# Patient Record
Sex: Male | Born: 1985 | Race: White | Hispanic: No | Marital: Married | Smoking: Current every day smoker
Health system: Southern US, Community
[De-identification: ages and names within clinical notes are randomized; demographics above are authoritative.]

---

## 2019-07-27 ENCOUNTER — Encounter (HOSPITAL_COMMUNITY): Payer: Self-pay

## 2019-07-27 ENCOUNTER — Emergency Department (HOSPITAL_COMMUNITY)
Admission: EM | Admit: 2019-07-27 | Discharge: 2019-07-27 | Disposition: A | Discharge status: Home / Self Care | Payer: Self-pay | Attending: Emergency Medicine | Admitting: Emergency Medicine

## 2019-07-27 ENCOUNTER — Other Ambulatory Visit: Payer: Self-pay

## 2019-07-27 DIAGNOSIS — W268XXA Contact with other sharp object(s), not elsewhere classified, initial encounter: Secondary | ICD-10-CM | POA: Insufficient documentation

## 2019-07-27 DIAGNOSIS — Y990 Civilian activity done for income or pay: Secondary | ICD-10-CM | POA: Insufficient documentation

## 2019-07-27 DIAGNOSIS — Y9289 Other specified places as the place of occurrence of the external cause: Secondary | ICD-10-CM | POA: Insufficient documentation

## 2019-07-27 DIAGNOSIS — S61012A Laceration without foreign body of left thumb without damage to nail, initial encounter: Secondary | ICD-10-CM | POA: Insufficient documentation

## 2019-07-27 DIAGNOSIS — Y9389 Activity, other specified: Secondary | ICD-10-CM | POA: Insufficient documentation

## 2019-07-27 NOTE — ED Triage Notes (Signed)
Pt cut his left thumb with a sheet rock knife. Stated he put super glue in it. No bleeding at this time.

## 2019-07-27 NOTE — Discharge Instructions (Addendum)
I have placed Dermabond to your left finger, please keep this in place until it will eventually fall off.  You may keep the wound clean and dry for the next 24 hours.

## 2019-07-27 NOTE — ED Provider Notes (Signed)
[facility] EMERGENCY DEPARTMENT Provider Note   CSN: [id number] Arrival date & time: 07/27/19  1657     History Chief Complaint  Patient presents with  . Extremity Laceration    Noah Wang is a 34 y.o. male.  34 y.o male with no PMH presents to the ED with a chief complaint of left finger laceration x pta. Patient was at work when he suddenly had a piece of sheet rock cut the left thumb finger. Reports attempting to clean it, applying pressure and placing super glue on it. He has a tetanus vaccine within the past two years. He denies any numbness, or other injury.   The history is provided by the patient.       History reviewed. No pertinent past medical history.  There are no problems to display for this patient.   History reviewed. No pertinent surgical history.     No family history on file.  Social History   Tobacco Use  . Smoking status: Not on file  Substance Use Topics  . Alcohol use: Not on file  . Drug use: Not on file    Home Medications Prior to Admission medications   Not on File    Allergies    Patient has no allergy information on record.  Review of Systems   Review of Systems  Constitutional: Negative for fever.  Skin: Positive for wound.    Physical Exam Updated Vital Signs BP 134/87   Pulse 78   Temp 98 F (36.7 C) (Oral)   Resp 16   Ht 6\' 2"  (1.88 m)   Wt 111.1 kg   SpO2 98%   BMI 31.46 kg/m   Physical Exam Vitals and nursing note reviewed.  Constitutional:      Appearance: Normal appearance.  HENT:     Head: Normocephalic and atraumatic.  Eyes:     Pupils: Pupils are equal, round, and reactive to light.  Cardiovascular:     Rate and Rhythm: Normal rate.  Pulmonary:     Effort: Pulmonary effort is normal.  Abdominal:     General: Abdomen is flat.  Musculoskeletal:     Left hand: Laceration and tenderness present. No bony tenderness. Normal strength. Normal sensation. There is no disruption  of two-point discrimination. Normal capillary refill. Normal pulse.     Cervical back: Normal range of motion and neck supple.     Comments: Pulses present, capillary refill intact. No nail involvement. 1cm laceration to the left thumb.  Skin:    General: Skin is warm and dry.  Neurological:     Mental Status: He is alert and oriented to person, place, and time.     ED Results / Procedures / Treatments   Labs (all labs ordered are listed, but only abnormal results are displayed) Labs Reviewed - No data to display  EKG None  Radiology No results found.  Procedures . Laceration Repair  Date/Time: 07/27/2019 6:00 PM Performed by: 07/29/2019, PA-C Authorized by: Claude Manges, PA-C   Consent:    Consent obtained:  Verbal   Consent given by:  Patient   Risks discussed:  Infection, pain and poor cosmetic result   Alternatives discussed:  No treatment Anesthesia (see MAR for exact dosages):    Anesthesia method:  None Laceration details:    Location:  Finger   Finger location:  L thumb   Length (cm):  1 Repair type:    Repair type:  Simple Exploration:    Hemostasis achieved  with:  Direct pressure Treatment:    Area cleansed with:  Saline   Amount of cleaning:  Extensive Skin repair:    Repair method:  Tissue adhesive Approximation:    Approximation:  Close Post-procedure details:    Dressing:  Open (no dressing)   Patient tolerance of procedure:  Tolerated well, no immediate complications   (including critical care time)  Medications Ordered in ED Medications - No data to display  ED Course  I have reviewed the triage vital signs and the nursing notes.  Pertinent labs & imaging results that were available during my care of the patient were reviewed by me and considered in my medical decision making (see chart for details).    MDM Rules/Calculators/A&P    Patient with left thumb laceration approximately 1 cm, no nail involvement. Tetanus is up to date.    Exam is reassuring, no deep penetration. Neurovascularly intact. Will repair with dermabond. Return precautions discussed at length. Patient stable for discharge.    Portions of this note were generated with Scientist, clinical (histocompatibility and immunogenetics). Dictation errors may occur despite best attempts at proofreading.  Final Clinical Impression(s) / ED Diagnoses Final diagnoses:  Laceration of left thumb without foreign body without damage to nail, initial encounter    Rx / DC Orders ED Discharge Orders    None       Claude Manges, PA-C 07/27/19 1801    Pricilla Loveless, MD 07/27/19 2322

## 2020-09-16 ENCOUNTER — Emergency Department (HOSPITAL_COMMUNITY): Payer: Self-pay

## 2020-09-16 ENCOUNTER — Other Ambulatory Visit: Payer: Self-pay

## 2020-09-16 ENCOUNTER — Emergency Department (HOSPITAL_COMMUNITY)
Admission: EM | Admit: 2020-09-16 | Discharge: 2020-09-16 | Disposition: A | Discharge status: Home / Self Care | Payer: Self-pay | Attending: Emergency Medicine | Admitting: Emergency Medicine

## 2020-09-16 ENCOUNTER — Encounter (HOSPITAL_COMMUNITY): Payer: Self-pay

## 2020-09-16 DIAGNOSIS — K625 Hemorrhage of anus and rectum: Secondary | ICD-10-CM | POA: Insufficient documentation

## 2020-09-16 DIAGNOSIS — R1011 Right upper quadrant pain: Secondary | ICD-10-CM

## 2020-09-16 DIAGNOSIS — K5792 Diverticulitis of intestine, part unspecified, without perforation or abscess without bleeding: Secondary | ICD-10-CM | POA: Insufficient documentation

## 2020-09-16 DIAGNOSIS — F17210 Nicotine dependence, cigarettes, uncomplicated: Secondary | ICD-10-CM | POA: Insufficient documentation

## 2020-09-16 DIAGNOSIS — R7309 Other abnormal glucose: Secondary | ICD-10-CM | POA: Insufficient documentation

## 2020-09-16 LAB — URINALYSIS, ROUTINE W REFLEX MICROSCOPIC
Bacteria, UA: NONE SEEN
Bilirubin Urine: NEGATIVE
Glucose, UA: NEGATIVE mg/dL
Hgb urine dipstick: NEGATIVE
Ketones, ur: NEGATIVE mg/dL
Leukocytes,Ua: NEGATIVE
Nitrite: NEGATIVE
Protein, ur: NEGATIVE mg/dL
Specific Gravity, Urine: >1.03 — ABNORMAL HIGH (ref 1.005–1.030)
pH: 5.5 (ref 5.0–8.0)

## 2020-09-16 LAB — CBC WITH DIFFERENTIAL/PLATELET
Abs Immature Granulocytes: 0.11 10*3/uL — ABNORMAL HIGH (ref 0.00–0.07)
Basophils Absolute: 0.1 10*3/uL (ref 0.0–0.1)
Basophils Relative: 1 %
Eosinophils Absolute: 0.4 10*3/uL (ref 0.0–0.5)
Eosinophils Relative: 3 %
HCT: 51 % (ref 39.0–52.0)
Hemoglobin: 17.3 g/dL — ABNORMAL HIGH (ref 13.0–17.0)
Immature Granulocytes: 1 %
Lymphocytes Relative: 15 %
Lymphs Abs: 2.2 10*3/uL (ref 0.7–4.0)
MCH: 33.5 pg (ref 26.0–34.0)
MCHC: 33.9 g/dL (ref 30.0–36.0)
MCV: 98.6 fL (ref 80.0–100.0)
Monocytes Absolute: 1.1 10*3/uL — ABNORMAL HIGH (ref 0.1–1.0)
Monocytes Relative: 8 %
Neutro Abs: 10.1 10*3/uL — ABNORMAL HIGH (ref 1.7–7.7)
Neutrophils Relative %: 72 %
Platelets: 330 10*3/uL (ref 150–400)
RBC: 5.17 MIL/uL (ref 4.22–5.81)
RDW: 12.4 % (ref 11.5–15.5)
WBC: 14 10*3/uL — ABNORMAL HIGH (ref 4.0–10.5)
nRBC: 0 % (ref 0.0–0.2)

## 2020-09-16 LAB — COMPREHENSIVE METABOLIC PANEL
ALT: 198 U/L — ABNORMAL HIGH (ref 0–44)
AST: 113 U/L — ABNORMAL HIGH (ref 15–41)
Albumin: 4.9 g/dL (ref 3.5–5.0)
Alkaline Phosphatase: 96 U/L (ref 38–126)
Anion gap: 10 (ref 5–15)
BUN: 11 mg/dL (ref 6–20)
CO2: 24 mmol/L (ref 22–32)
Calcium: 10.2 mg/dL (ref 8.9–10.3)
Chloride: 105 mmol/L (ref 98–111)
Creatinine, Ser: 0.89 mg/dL (ref 0.61–1.24)
GFR, Estimated: >60 mL/min (ref >60)
Glucose, Bld: 109 mg/dL — ABNORMAL HIGH (ref 70–99)
Potassium: 4.6 mmol/L (ref 3.5–5.1)
Sodium: 139 mmol/L (ref 135–145)
Total Bilirubin: 0.8 mg/dL (ref 0.3–1.2)
Total Protein: 8.4 g/dL — ABNORMAL HIGH (ref 6.5–8.1)

## 2020-09-16 LAB — LIPASE, BLOOD: Lipase: 35 U/L (ref 11–51)

## 2020-09-16 LAB — CBG MONITORING, ED: Glucose-Capillary: 114 mg/dL — ABNORMAL HIGH (ref 70–99)

## 2020-09-16 LAB — POC OCCULT BLOOD, ED: Fecal Occult Bld: POSITIVE — AB

## 2020-09-16 MED ORDER — SODIUM CHLORIDE 0.9 % IV BOLUS
1000.0000 mL | Freq: Once | INTRAVENOUS | Status: AC
  Administered 2020-09-16: 1000 mL via INTRAVENOUS

## 2020-09-16 MED ORDER — ONDANSETRON 4 MG PO TBDP: 4.0000 mg | ORAL_TABLET | Freq: Three times a day (TID) | ORAL | 0 refills | Status: AC | PRN

## 2020-09-16 MED ORDER — MORPHINE SULFATE (PF) 4 MG/ML IV SOLN
4.0000 mg | Freq: Once | INTRAVENOUS | Status: AC
  Administered 2020-09-16: 4 mg via INTRAVENOUS
  Filled 2020-09-16: qty 1

## 2020-09-16 MED ORDER — ONDANSETRON HCL 4 MG/2ML IJ SOLN
4.0000 mg | Freq: Once | INTRAMUSCULAR | Status: AC
  Administered 2020-09-16: 4 mg via INTRAVENOUS
  Filled 2020-09-16: qty 2

## 2020-09-16 MED ORDER — IOHEXOL 350 MG/ML SOLN
80.0000 mL | Freq: Once | INTRAVENOUS | Status: AC | PRN
  Administered 2020-09-16: 80 mL via INTRAVENOUS

## 2020-09-16 MED ORDER — AMOXICILLIN-POT CLAVULANATE 875-125 MG PO TABS: 1.0000 | ORAL_TABLET | Freq: Two times a day (BID) | ORAL | 0 refills | Status: AC

## 2020-09-16 MED ORDER — ACETAMINOPHEN 500 MG PO TABS
1000.0000 mg | ORAL_TABLET | Freq: Once | ORAL | Status: AC
  Administered 2020-09-16: 1000 mg via ORAL
  Filled 2020-09-16: qty 2

## 2020-09-16 NOTE — ED Provider Notes (Signed)
Emergency Medicine Provider Triage Evaluation Note  Noah Wang , a 35 y.o. male  was evaluated in triage.  Pt complains of approximately 3 to 4 days of right-sided abdominal pain and cramping, decreased appetite.  Today he began having bright red blood per rectum.  No lightheadedness or syncope.  No history of inflammatory bowel disease or abdominal surgeries.  Review of Systems  Positive: Abdominal pain, rectal bleeding Negative: Rectal pain  Physical Exam  BP (!) 152/114   Pulse (!) 109   Temp 98.2 F (36.8 C) (Oral)   Resp 18   Ht 6\' 3"  (1.905 m)   Wt 116.3 kg   SpO2 99%   BMI 32.06 kg/m  Gen:   Awake, no distress   Resp:  Normal effort  MSK:   Moves extremities without difficulty  Other:  Abdomen, generalized right-sided abdominal tenderness, mild to moderate, without rebound or guarding  Medical Decision Making  Medically screening exam initiated at 1:13 PM.  Appropriate orders placed.  Noah Wang was informed that the remainder of the evaluation will be completed by another provider, this initial triage assessment does not replace that evaluation, and the importance of remaining in the ED until their evaluation is complete.     Duard Brady, PA-C 09/16/20 1314    11/16/20, MD 09/17/20 1308

## 2020-09-16 NOTE — ED Provider Notes (Signed)
[facility] DEPT Provider Note   CSN: [id number] Arrival date & time: 09/16/20  1128     History Chief Complaint  Patient presents with   Abdominal Pain   Rectal Bleeding    Noah Wang is a 35 y.o. male with no past pertinent medical history.  Presents to the emergency department with a chief plaint of right-sided abdominal pain and rectal bleeding.  Patient reports that he has had abdominal pain over the last 3 to 4 days.  Pain is located on the right side of his abdomen and radiates to his umbilicus.  Patient reports that pain is constant however waxes and wanes in intensity.  Patient describes pain as a "cramping and twisting pain."  At present patient rates pain 1/10 on the pain scale.  Patient denies any aggravating factors.  Patient has had minimal relief with over-the-counter pain medication.  Patient states that this morning had bright red blood mixed in with his stool.  Patient reports 4 episodes of bloody stool.  Patient approximates 1 pint of blood throughout all of his bowel movements this morning.  Patient denies any pain with defecation, melena.  Patient denies any receptive anal sex.  Additionally patient endorses nausea and decreased appetite over the last 3 to 4 days.  Patient denies any fevers, chills, constipation, diarrhea, abdominal distention, dysuria, hematuria, urinary frequency, penile discharge, swelling or tenderness to genitals, genital sores or lesions, back pain, lightheadedness, syncope.  Patient endorses occasional EtOH use, last drink was 2 days prior.  Patient states that he has 6-8 beers weekly.  Patient endorses occasional cocaine use.  Patient reports that he had 1 line of cocaine last week.  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   Abdominal Pain Associated symptoms: hematochezia and nausea   Associated symptoms: no chest pain, no chills, no constipation, no diarrhea, no dysuria,  no fever, no hematuria, no shortness of breath and no vomiting   Rectal Bleeding Associated symptoms: abdominal pain   Associated symptoms: no dizziness, no fever, no light-headedness and no vomiting       History reviewed. No pertinent past medical history.  There are no problems to display for this patient.   History reviewed. No pertinent surgical history.     History reviewed. No pertinent family history.  Social History   Tobacco Use   Smoking status: Every Day    Packs/day: 1.00    Types: Cigarettes   Smokeless tobacco: Never    Home Medications Prior to Admission medications   Not on File    Allergies    Patient has no known allergies.  Review of Systems   Review of Systems  Constitutional:  Negative for chills and fever.  Eyes:  Negative for visual disturbance.  Respiratory:  Negative for shortness of breath.   Cardiovascular:  Negative for chest pain.  Gastrointestinal:  Positive for abdominal pain, blood in stool, hematochezia and nausea. Negative for abdominal distention, anal bleeding, constipation, diarrhea, rectal pain and vomiting.  Genitourinary:  Negative for difficulty urinating, dysuria, flank pain, frequency, genital sores, hematuria, penile discharge, penile pain, penile swelling, scrotal swelling and testicular pain.  Musculoskeletal:  Negative for back pain and neck pain.  Skin:  Negative for color change and rash.  Neurological:  Negative for dizziness, syncope, light-headedness and headaches.  Psychiatric/Behavioral:  Negative for confusion.    Physical Exam Updated Vital Signs BP (!) 152/114   Pulse (!) 109  Temp 98.2 F (36.8 C) (Oral)   Resp 18   Ht '6\' 3"'  (1.905 m)   Wt 116.3 kg   SpO2 99%   BMI 32.06 kg/m   Physical Exam Vitals and nursing note reviewed. Exam conducted with a chaperone present (Male RN present as chaperone).  Constitutional:      General: He is not in acute distress.    Appearance: He is not ill-appearing,  toxic-appearing or diaphoretic.  HENT:     Head: Normocephalic.  Eyes:     General: No scleral icterus.       Right eye: No discharge.        Left eye: No discharge.  Cardiovascular:     Rate and Rhythm: Normal rate.  Pulmonary:     Effort: Pulmonary effort is normal.  Abdominal:     General: Abdomen is protuberant. Bowel sounds are normal. There is no distension. There are no signs of injury.     Palpations: Abdomen is soft. There is no mass or pulsatile mass.     Tenderness: There is abdominal tenderness in the right upper quadrant and right lower quadrant. There is no right CVA tenderness, left CVA tenderness, guarding or rebound. Positive signs include Murphy's sign and McBurney's sign.     Hernia: There is no hernia in the umbilical area or ventral area.  Genitourinary:    Rectum: No mass, tenderness, anal fissure, external hemorrhoid or internal hemorrhoid. Normal anal tone.     Comments: Minimal light brown stool noted in rectal vault, small amount of bright red blood noted finger after digital rectal exam. Musculoskeletal:     Right lower leg: Normal.     Left lower leg: Normal.  Skin:    General: Skin is warm and dry.  Neurological:     General: No focal deficit present.     Mental Status: He is alert.  Psychiatric:        Behavior: Behavior is cooperative.    ED Results / Procedures / Treatments   Labs (all labs ordered are listed, but only abnormal results are displayed) Labs Reviewed  CBC WITH DIFFERENTIAL/PLATELET - Abnormal; Notable for the following components:      Result Value   WBC 14.0 (*)    Hemoglobin 17.3 (*)    Neutro Abs 10.1 (*)    Monocytes Absolute 1.1 (*)    Abs Immature Granulocytes 0.11 (*)    All other components within normal limits  COMPREHENSIVE METABOLIC PANEL - Abnormal; Notable for the following components:   Glucose, Bld 109 (*)    Total Protein 8.4 (*)    AST 113 (*)    ALT 198 (*)    All other components within normal limits   URINALYSIS, ROUTINE W REFLEX MICROSCOPIC - Abnormal; Notable for the following components:   Color, Urine YELLOW (*)    APPearance CLEAR (*)    Specific Gravity, Urine >1.030 (*)    All other components within normal limits  POC OCCULT BLOOD, ED - Abnormal; Notable for the following components:   Fecal Occult Bld POSITIVE (*)    All other components within normal limits  CBG MONITORING, ED - Abnormal; Notable for the following components:   Glucose-Capillary 114 (*)    All other components within normal limits  LIPASE, BLOOD    EKG None  Radiology CT Abdomen Pelvis W Contrast  Result Date: 09/16/2020 CLINICAL DATA:  Diffuse abdominal pain with bright red rectal bleeding. EXAM: CT ABDOMEN AND PELVIS WITH CONTRAST TECHNIQUE:  Multidetector CT imaging of the abdomen and pelvis was performed using the standard protocol following bolus administration of intravenous contrast. CONTRAST:  70m OMNIPAQUE IOHEXOL 350 MG/ML SOLN COMPARISON:  None. FINDINGS: Lower chest: No acute abnormality. Hepatobiliary: Severe diffuse hepatic steatosis with some geographic areas of sparing. Gallbladder is unremarkable. No biliary ductal dilation. Pancreas: No evidence of acute inflammation. No pancreatic ductal dilation. Spleen: Unremarkable. Adrenals/Urinary Tract: Adrenal glands are unremarkable. Kidneys are normal, without renal calculi, solid enhancing lesion, or hydronephrosis. Bladder is unremarkable for degree of distension. Stomach/Bowel: Stomach is unremarkable for degree of distension. No pathologic dilation of small or large bowel. The appendix and terminal ileum appear normal. Colonic diverticulosis without findings of acute diverticulitis. No evidence of acute bowel inflammation. Vascular/Lymphatic: Scattered aortic atherosclerosis without aneurysmal dilation. No pathologically enlarged abdominal or pelvic lymph nodes. Reproductive: Prostate is unremarkable. Other: No abdominopelvic ascites. Musculoskeletal:  L5 and T7 vertebral body hemangiomas. No acute osseous abnormality. IMPRESSION: 1. No acute findings in the abdomen or pelvis. 2. Severe diffuse hepatic steatosis with geographic areas of sparing. 3. Colonic diverticulosis without findings of acute diverticulitis. 4.  Aortic Atherosclerosis (ICD10-I70.0). Electronically Signed   By: JDahlia BailiffM.D.   On: 09/16/2020 18:33   [place]Abdomen Limited RUQ (LIVER/GB)  Result Date: 09/16/2020 CLINICAL DATA:  Right upper quadrant abdominal pain. EXAM: ULTRASOUND ABDOMEN LIMITED RIGHT UPPER QUADRANT COMPARISON:  CT of the abdomen and pelvis with contrast is 09/16/2020 FINDINGS: Gallbladder: No gallstones or wall thickening visualized. No sonographic Murphy sign noted by sonographer. Common bile duct: Diameter: Month clearly seen. Liver: Liver is diffusely hyperechoic compatible with narrowing fatty infiltration. No discrete lesions are present. Portal vein is patent on color Doppler imaging with normal direction of blood flow towards the liver. Other: None. IMPRESSION: 1. Hepatic steatosis. 2. Poor visualization of the gallbladder. The gallbladder appears to be normal. Electronically Signed   By: CSan MorelleM.D.   On: 09/16/2020 20:59    Procedures Procedures   Medications Ordered in ED Medications  acetaminophen (TYLENOL) tablet 1,000 mg (1,000 mg Oral Given 09/16/20 1618)  ondansetron (ZOFRAN) injection 4 mg (4 mg Intravenous Given 09/16/20 1618)  iohexol (OMNIPAQUE) 350 MG/ML injection 80 mL (80 mLs Intravenous Contrast Given 09/16/20 1803)  sodium chloride 0.9 % bolus 1,000 mL (0 mLs Intravenous Stopped 09/16/20 2118)  morphine 4 MG/ML injection 4 mg (4 mg Intravenous Given 09/16/20 2005)    ED Course  I have reviewed the triage vital signs and the nursing notes.  Pertinent labs & imaging results that were available during my care of the patient were reviewed by me and considered in my medical decision making (see chart for details).    MDM  Rules/Calculators/A&P                           Alert 35year old male in no acute stress, nontoxic-appearing.  Presents to ED with chief complaint of abdominal pain and bright red blood in stool.  Abdomen protuberant, soft, nondistended, tenderness to right upper and lower quadrant, no guarding or rebound tenderness.  Based on patient's physical exam and HPI concern for possible appendicitis or inflammatory bowel disease.  Basic lab work and CT scan obtained while patient was in progress.  CBC showed leukocytosis of 14.0, no signs of anemia. CMP shows AST and ALT elevated at 113 and 198 respectively.  Alk phos and total bili within normal limits, low suspicion for acute cholangitis at this time. Lipase within  normal limits, low suspicion for acute finger tightness at this time. Urinalysis shows no signs of infection Hemoccult positive  CT abdomen pelvis shows no acute findings in abdomen or pelvis.  Severe diffuse hepatic steatosis.  Colonic diverticulosis without findings of acute diverticulitis.  Due to patient's positive Murphy sign and elevated liver enzymes will obtain right upper quadrant ultrasound to evaluate for acute cholecystitis.  Right upper quadrant ultrasound shows hepatic steatosis, gallbladder appears to be normal.  Suspect that patient bloody stool is due to diverticular bleed.  Due to patient's abdominal pain and visualized diverticulosis will give patient 7-day course of Augmentin to treat for diverticulitis.  Patient will follow-up with gastroenterology.  Discussed results, findings, treatment and follow up. Patient advised of return precautions. Patient verbalized understanding and agreed with plan.  Patient care and treatment were discussed with attending physician Dr. Sherry Ruffing   Final Clinical Impression(s) / ED Diagnoses Final diagnoses:  Right upper quadrant pain  Diverticulitis    Rx / DC Orders ED Discharge Orders          Ordered     amoxicillin-clavulanate (AUGMENTIN) 875-125 MG tablet  Every 12 hours        09/16/20 2114    ondansetron (ZOFRAN ODT) 4 MG disintegrating tablet  Every 8 hours PRN        09/16/20 2115             Loni Beckwith, PA-C 09/17/20 5750    Tegeler, Gwenyth Allegra, MD 09/18/20 0010

## 2020-09-16 NOTE — ED Notes (Signed)
Pt given sandwich and ginger ale per [place] PA.

## 2020-09-16 NOTE — ED Triage Notes (Signed)
Pt arrived via POV, c/o RLQ abd pain x3 days, and bloody stools today.

## 2020-09-16 NOTE — Discharge Instructions (Addendum)
You came to the emergency room today with complaints of abdominal pain.  The ultrasound showed that you have hepatic steatosis.  The gallbladder was unremarkable.  CT scanning showed that you have diverticulosis.  With your complains of pain concern for possible diverticulitis.  Due to this we have started the antibiotic Augmentin.  You will need to follow-up with gastroenterology.    You may have diarrhea from the antibiotics.  It is very important that you continue to take the antibiotics even if you get diarrhea unless a medical professional tells you that you may stop taking them.  If you stop too early the bacteria you are being treated for will become stronger and you may need different, more powerful antibiotics that have more side effects and worsening diarrhea.  Please stay well hydrated and consider probiotics as they may decrease the severity of your diarrhea.   Please take Ibuprofen (Advil, motrin) and Tylenol (acetaminophen) to relieve your pain.    You may take up to 600 MG (3 pills) of normal strength ibuprofen every 8 hours as needed.   You make take tylenol, up to 1,000 mg (two extra strength pills) every 8 hours as needed.   It is safe to take ibuprofen and tylenol at the same time as they work differently.   Do not take more than 3,000 mg tylenol in a 24 hour period (not more than one dose every 8 hours.  Please check all medication labels as many medications such as pain and cold medications may contain tylenol.  Do not drink alcohol while taking these medications.  Do not take other NSAID'S while taking ibuprofen (such as aleve or naproxen).  Please take ibuprofen with food to decrease stomach upset.  Get help right away if: Your pain gets worse. Your symptoms do not get better with treatment. Your symptoms suddenly get worse. You have a fever. You vomit more than one time. You have stools that are bloody, black, or tarry.

## 2020-11-16 ENCOUNTER — Emergency Department (HOSPITAL_COMMUNITY)
Admission: EM | Admit: 2020-11-16 | Discharge: 2020-11-16 | Disposition: A | Discharge status: Home / Self Care | Payer: Medicaid Other | Attending: Emergency Medicine | Admitting: Emergency Medicine

## 2020-11-16 ENCOUNTER — Emergency Department (HOSPITAL_COMMUNITY): Payer: Medicaid Other

## 2020-11-16 ENCOUNTER — Encounter (HOSPITAL_COMMUNITY): Payer: Self-pay | Admitting: Emergency Medicine

## 2020-11-16 DIAGNOSIS — Z20822 Contact with and (suspected) exposure to covid-19: Secondary | ICD-10-CM | POA: Insufficient documentation

## 2020-11-16 DIAGNOSIS — Z720 Tobacco use: Secondary | ICD-10-CM

## 2020-11-16 DIAGNOSIS — I10 Essential (primary) hypertension: Secondary | ICD-10-CM

## 2020-11-16 DIAGNOSIS — J40 Bronchitis, not specified as acute or chronic: Secondary | ICD-10-CM

## 2020-11-16 DIAGNOSIS — F17210 Nicotine dependence, cigarettes, uncomplicated: Secondary | ICD-10-CM | POA: Insufficient documentation

## 2020-11-16 LAB — RESP PANEL BY RT-PCR (FLU A&B, COVID) ARPGX2
Influenza A by PCR: NEGATIVE
Influenza B by PCR: NEGATIVE
SARS Coronavirus 2 by RT PCR: NEGATIVE

## 2020-11-16 MED ORDER — ALBUTEROL SULFATE HFA 108 (90 BASE) MCG/ACT IN AERS
4.0000 | INHALATION_SPRAY | Freq: Once | RESPIRATORY_TRACT | Status: AC
  Administered 2020-11-16: 4 via RESPIRATORY_TRACT

## 2020-11-16 MED ORDER — AEROCHAMBER PLUS FLO-VU MEDIUM MISC
1.0000 | Freq: Once | Status: AC
  Administered 2020-11-16: 1
  Filled 2020-11-16: qty 1

## 2020-11-16 MED ORDER — DEXAMETHASONE 4 MG PO TABS
6.0000 mg | ORAL_TABLET | Freq: Once | ORAL | Status: AC
  Administered 2020-11-16: 6 mg via ORAL
  Filled 2020-11-16: qty 1

## 2020-11-16 MED ORDER — ALBUTEROL SULFATE HFA 108 (90 BASE) MCG/ACT IN AERS
2.0000 | INHALATION_SPRAY | Freq: Once | RESPIRATORY_TRACT | Status: AC
  Administered 2020-11-16: 2 via RESPIRATORY_TRACT
  Filled 2020-11-16: qty 6.7

## 2020-11-16 NOTE — Discharge Instructions (Signed)
I would recommend that you use this as motivation to stop smoking.  Today your flu and COVID test were negative. Your blood pressure was high which is most likely due to being sick.  Please avoid any decongesting medications.  If you need to take a cough medicine you can take Coricidin HBP which is safe for people with elevated blood pressures.  You can take Mucinex, but do not take Mucinex decongestant. You can use your inhaler every 4-6 hours as needed by using 2 puffs.  If you develop fevers, significant shortness of breath, or have other concerns please seek additional medical care. Today you were given steroids.  These can make you feel hot, flushed, have increased appetite and mood swings.  These should last for a few days.

## 2020-11-16 NOTE — ED Notes (Signed)
An After Visit Summary was printed and given to the patient. Discharge instructions given and no further questions at this time.  

## 2020-11-16 NOTE — ED Provider Notes (Signed)
[facility]-EMERGENCY DEPT Provider Note   CSN: [id number] Arrival date & time: 11/16/20  1842     History Chief Complaint  Patient presents with   Cough    Noah Wang is a 35 y.o. male who presents today for evaluation of cough.  He states that he has had a cough for about 2 days.  He feels like he hears a wheeze in his chest, he has been slightly short of breath when he had a coughing spell while walking but not otherwise. No leg swelling. He does smoke.  He denies a history of asthma.  No fevers.  He has not been around any known sick contacts.  No significant chest pain.  No headache or body aches.  No other concerns or complaints today.  HPI     History reviewed. No pertinent past medical history.  There are no problems to display for this patient.   History reviewed. No pertinent surgical history.     History reviewed. No pertinent family history.  Social History   Tobacco Use   Smoking status: Every Day    Packs/day: 1.00    Types: Cigarettes   Smokeless tobacco: Never    Home Medications Prior to Admission medications   Medication Sig Start Date End Date Taking? Authorizing Provider  amoxicillin-clavulanate (AUGMENTIN) 875-125 MG tablet Take 1 tablet by mouth every 12 (twelve) hours. 09/16/20   Haskel Schroeder, PA-C  ondansetron (ZOFRAN ODT) 4 MG disintegrating tablet Take 1 tablet (4 mg total) by mouth every 8 (eight) hours as needed for nausea or vomiting. 09/16/20   Haskel Schroeder, PA-C    Allergies    Other  Review of Systems   Review of Systems  Constitutional:  Negative for activity change, fatigue and fever.  HENT:  Negative for congestion and postnasal drip.   Eyes:  Negative for visual disturbance.  Respiratory:  Positive for cough and shortness of breath. Negative for chest tightness.   Cardiovascular:  Negative for chest pain and leg swelling.  Gastrointestinal:  Negative for abdominal pain.   Musculoskeletal:  Negative for back pain and neck pain.  Skin:  Negative for color change and rash.  Neurological:  Negative for weakness and headaches.  Psychiatric/Behavioral:  Negative for confusion.   All other systems reviewed and are negative.  Physical Exam Updated Vital Signs BP (!) 141/79   Pulse 90   Temp 98.1 F (36.7 C) (Oral)   Resp 18   Ht 6\' 3"  (1.905 m)   Wt 113.4 kg   SpO2 99%   BMI 31.25 kg/m   Physical Exam Constitutional:      General: He is not in acute distress.    Appearance: He is well-developed. He is not ill-appearing or diaphoretic.  HENT:     Head: Normocephalic and atraumatic.     Nose: Mucosal edema, congestion and rhinorrhea present.     Mouth/Throat:     Mouth: Mucous membranes are moist.     Pharynx: Oropharynx is clear. Uvula midline. No oropharyngeal exudate or posterior oropharyngeal erythema.     Tonsils: No tonsillar exudate.  Eyes:     General: No scleral icterus.    Conjunctiva/sclera: Conjunctivae normal.  Cardiovascular:     Rate and Rhythm: Normal rate and regular rhythm.  Pulmonary:     Effort: Pulmonary effort is normal.     Breath sounds: Wheezing and rhonchi present.     Comments: Occasional cough Abdominal:     Tenderness: There  is no abdominal tenderness. There is no guarding.  Musculoskeletal:        General: Normal range of motion.     Cervical back: Normal range of motion and neck supple.     Right lower leg: No edema.     Left lower leg: No edema.  Lymphadenopathy:     Cervical: No cervical adenopathy.  Skin:    General: Skin is warm and dry.  Neurological:     Mental Status: He is alert.     Comments: Patient is awake and alert.  Answers questions appropriately without difficulty.  Psychiatric:        Mood and Affect: Mood normal.        Behavior: Behavior normal.    ED Results / Procedures / Treatments   Labs (all labs ordered are listed, but only abnormal results are displayed) Labs Reviewed  RESP  PANEL BY RT-PCR (FLU A&B, COVID) [id number]    EKG None  Radiology DG Chest 2 View  Result Date: 11/16/2020 CLINICAL DATA:  Cough for 2 days.  Current smoker EXAM: CHEST - 2 VIEW COMPARISON:  None. FINDINGS: The cardiomediastinal contours are within normal limits. No focal consolidation. Mild prominence of the interstitium. No pneumothorax or pleural effusion. No acute finding in the visualized skeleton. IMPRESSION: Mild prominence of the interstitium which is nonspecific, could be represent bronchitic changes related to smoking history or viral infection in the acute setting. Electronically Signed   By: Emmaline Kluver M.D.   On: 11/16/2020 19:19    Procedures Procedures   Medications Ordered in ED Medications  albuterol (VENTOLIN HFA) 108 (90 Base) MCG/ACT inhaler 2 puff (2 puffs Inhalation Given 11/16/20 1913)  AeroChamber Plus Flo-Vu Medium MISC 1 each (1 each Other Given 11/16/20 1913)  albuterol (VENTOLIN HFA) 108 (90 Base) MCG/ACT inhaler 4 puff (4 puffs Inhalation Given 11/16/20 1956)  dexamethasone (DECADRON) tablet 6 mg (6 mg Oral Given 11/16/20 2130)    ED Course  I have reviewed the triage vital signs and the nursing notes.  Pertinent labs & imaging results that were available during my care of the patient were reviewed by me and considered in my medical decision making (see chart for details).  Clinical Course as of 11/16/20 2157  Thu Nov 16, 2020  1958 Patient reevaluated.  He still has diffuse wheezes bilaterally.  He feels like he has been increased coughing since the initial albuterol.  He does not feel like his chest is tight.  We will give additional dose of albuterol. [EH]    Clinical Course User Index [EH] Norman Clay   MDM Rules/Calculators/A&P                          Patient is a 35 year old man who presents today for evaluation of cough and hearing a wheeze in his chest. On exam he has diffuse wheezes bilaterally.  He does not have an asthma  history.  He does smoke, states that he only smokes about 4 cigarettes a day. Who COVID test is negative.  Chest x-ray is obtained showing changes that may be consistent with bronchitis.  Given that his flu and COVID test is negative does not require Tamiflu or other oral antivirals.  We discussed that he may have a different virus. He is treated in the emergency room with albuterol.  After this his lung sounds improved significantly and his saturation was 99% on room air.  He was slightly hypertensive  here which may be due to use of decongestants at home.  He was recommended to avoid decongestants.  He is given the inhaler to use at home. We will give him a one-time dose of Decadron here.  Work note is given.  Noah Wang was evaluated in Emergency Department on 11/16/2020 for the symptoms described in the history of present illness. He was evaluated in the context of the global COVID-19 pandemic, which necessitated consideration that the patient might be at risk for infection with the SARS-CoV-2 virus that causes COVID-19. Institutional protocols and algorithms that pertain to the evaluation of patients at risk for COVID-19 are in a state of rapid change based on information released by regulatory bodies including the CDC and federal and state organizations. These policies and algorithms were followed during the patient's care in the ED.   Return precautions were discussed with patient who states their understanding.  At the time of discharge patient denied any unaddressed complaints or concerns.  Patient is agreeable for discharge home.  Note: Portions of this report may have been transcribed using voice recognition software. Every effort was made to ensure accuracy; however, inadvertent computerized transcription errors may be present   Final Clinical Impression(s) / ED Diagnoses Final diagnoses:  Bronchitis  Tobacco use  Hypertension, unspecified type    Rx / DC Orders ED Discharge  Orders     None        Norman Clay 11/16/20 2201    Charlynne Pander, MD 11/16/20 [phone]

## 2020-11-16 NOTE — ED Triage Notes (Signed)
Pt states that he has had cough x 2 days. Alert and oriented. 96% RA

## 2022-09-20 IMAGING — US US ABDOMEN LIMITED
1 series · 14 of 25 positions shown · non-contrast
Comparison: CT of the abdomen and pelvis with contrast is
09/16/2020

CLINICAL DATA: Right upper quadrant abdominal pain.

EXAM:
ULTRASOUND ABDOMEN LIMITED RIGHT UPPER QUADRANT

[Series 1: us abdomen limited · 14 of 40 slices shown]
[im 1/40]
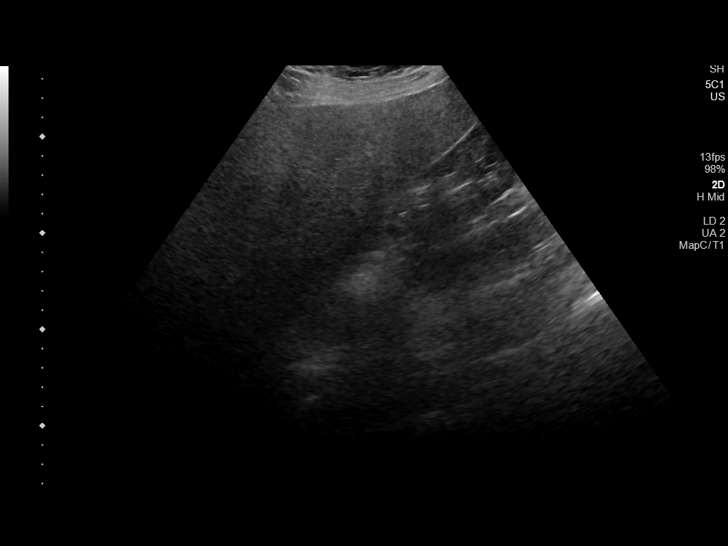
[im 4/40]
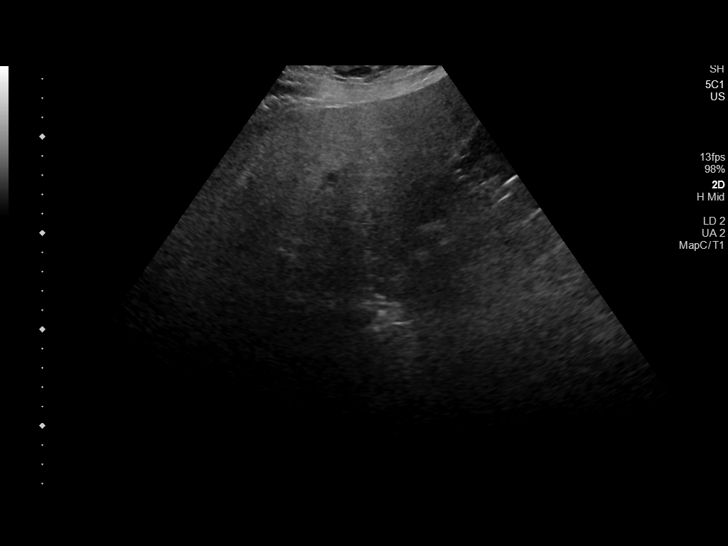
[im 7/40]
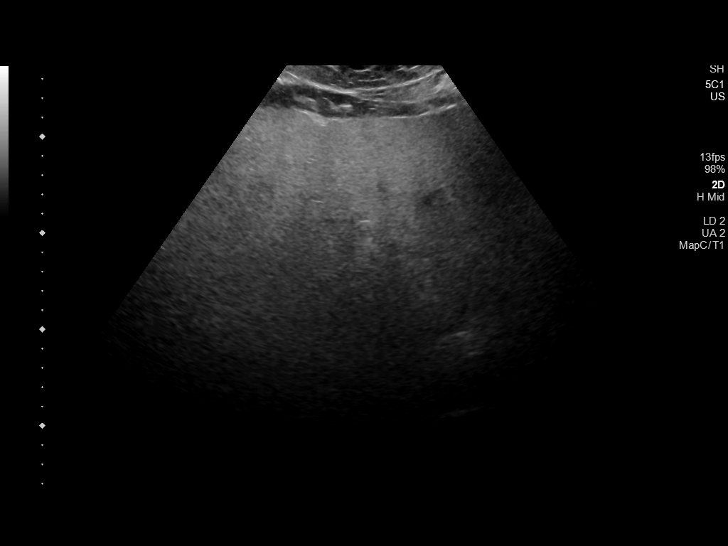
[im 10/40]
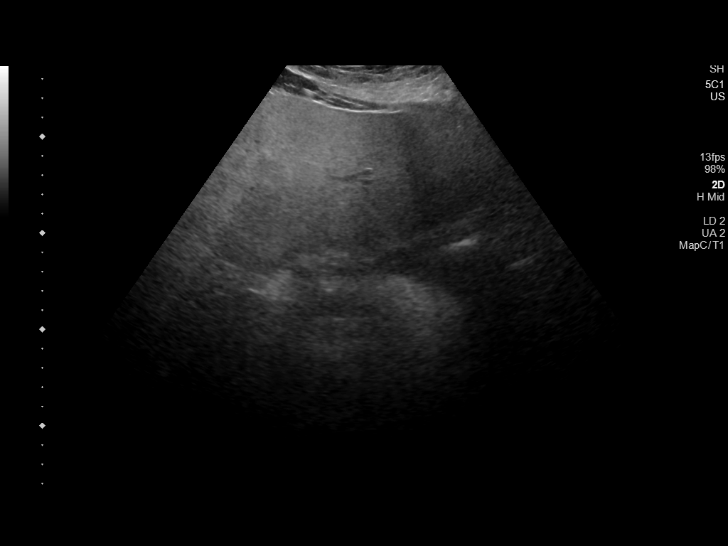
[im 14/40]
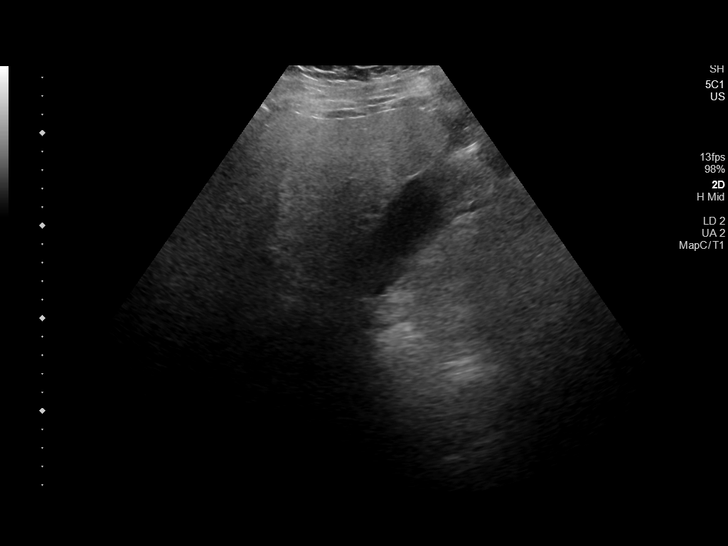
[im 15/40]
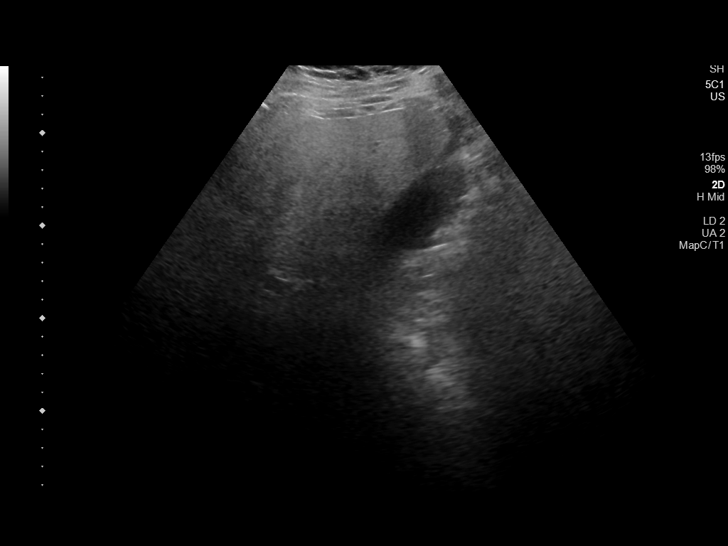
[im 18/40]
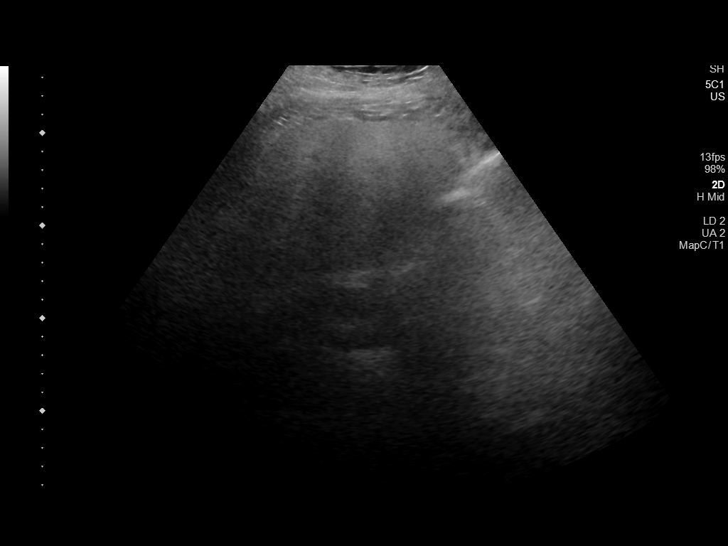
[im 22/40]
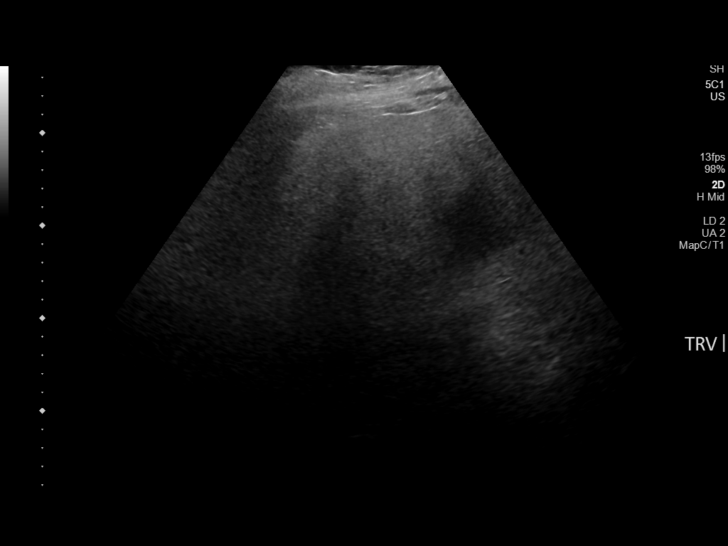
[im 25/40]
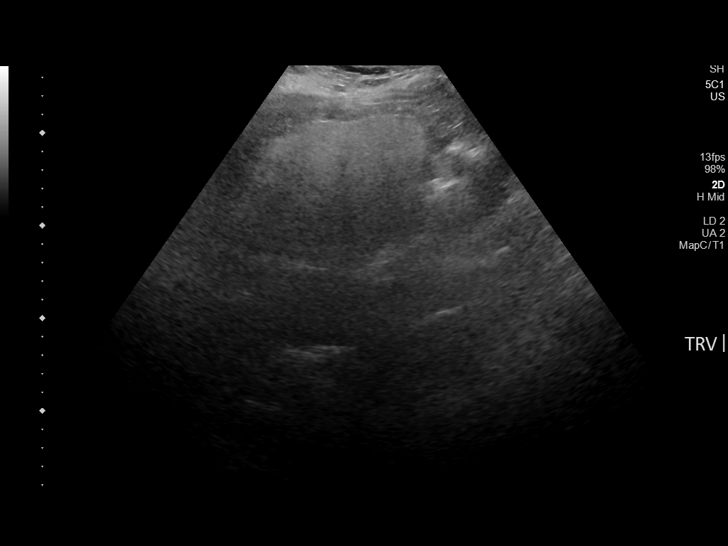
[im 27/40]
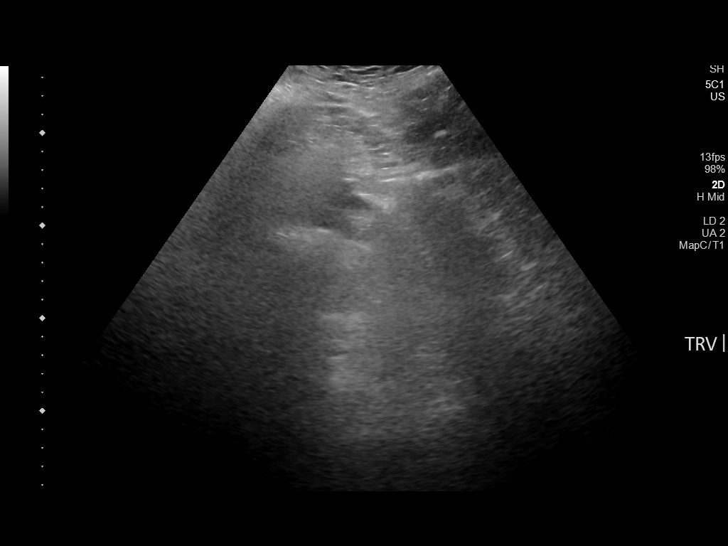
[im 30/40]
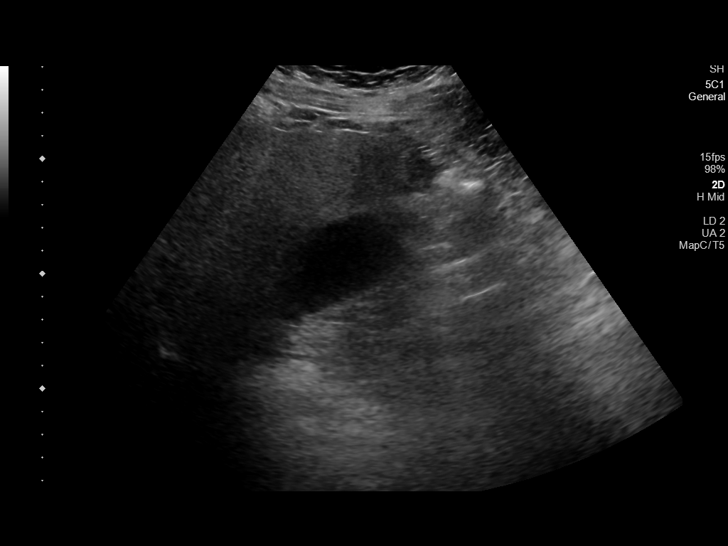
[im 33/40]
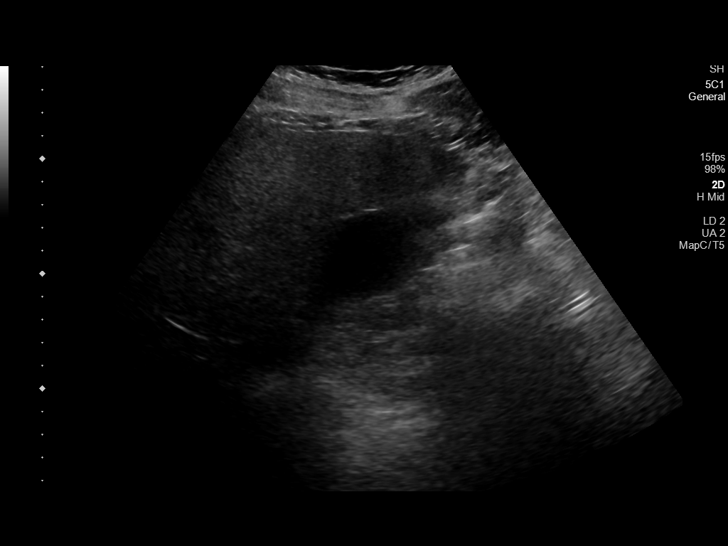
[im 36/40]
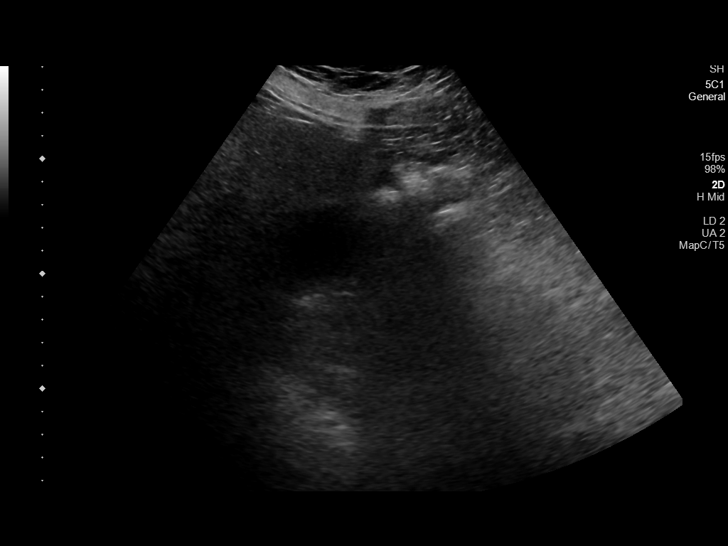
[im 40/40]
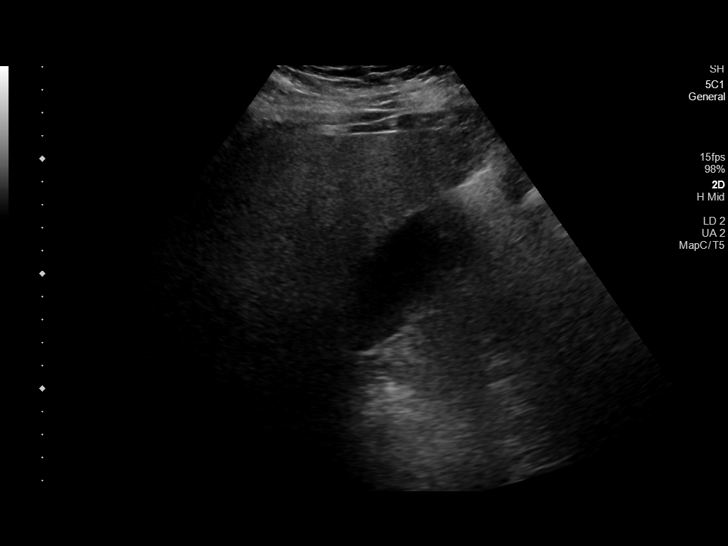

[14 of 25 positions shown; findings below may reference images not displayed]

FINDINGS: Gallbladder:

No gallstones or wall thickening visualized. No sonographic Murphy
sign noted by sonographer.

Common bile duct:

Diameter: Month clearly seen.

Liver:

Liver is diffusely hyperechoic compatible with narrowing fatty
infiltration. No discrete lesions are present. Portal vein is patent
on color Doppler imaging with normal direction of blood flow towards
the liver.

Other: None.
IMPRESSION: 1. Hepatic steatosis.
2. Poor visualization of the gallbladder. The gallbladder appears to
be normal.

## 2022-11-20 IMAGING — CR DG CHEST 2V
2 series · 2 of 2 positions shown · non-contrast
Comparison: None.

CLINICAL DATA: Cough for 2 days.  Current smoker

EXAM:
CHEST - 2 VIEW

[w chest pa]
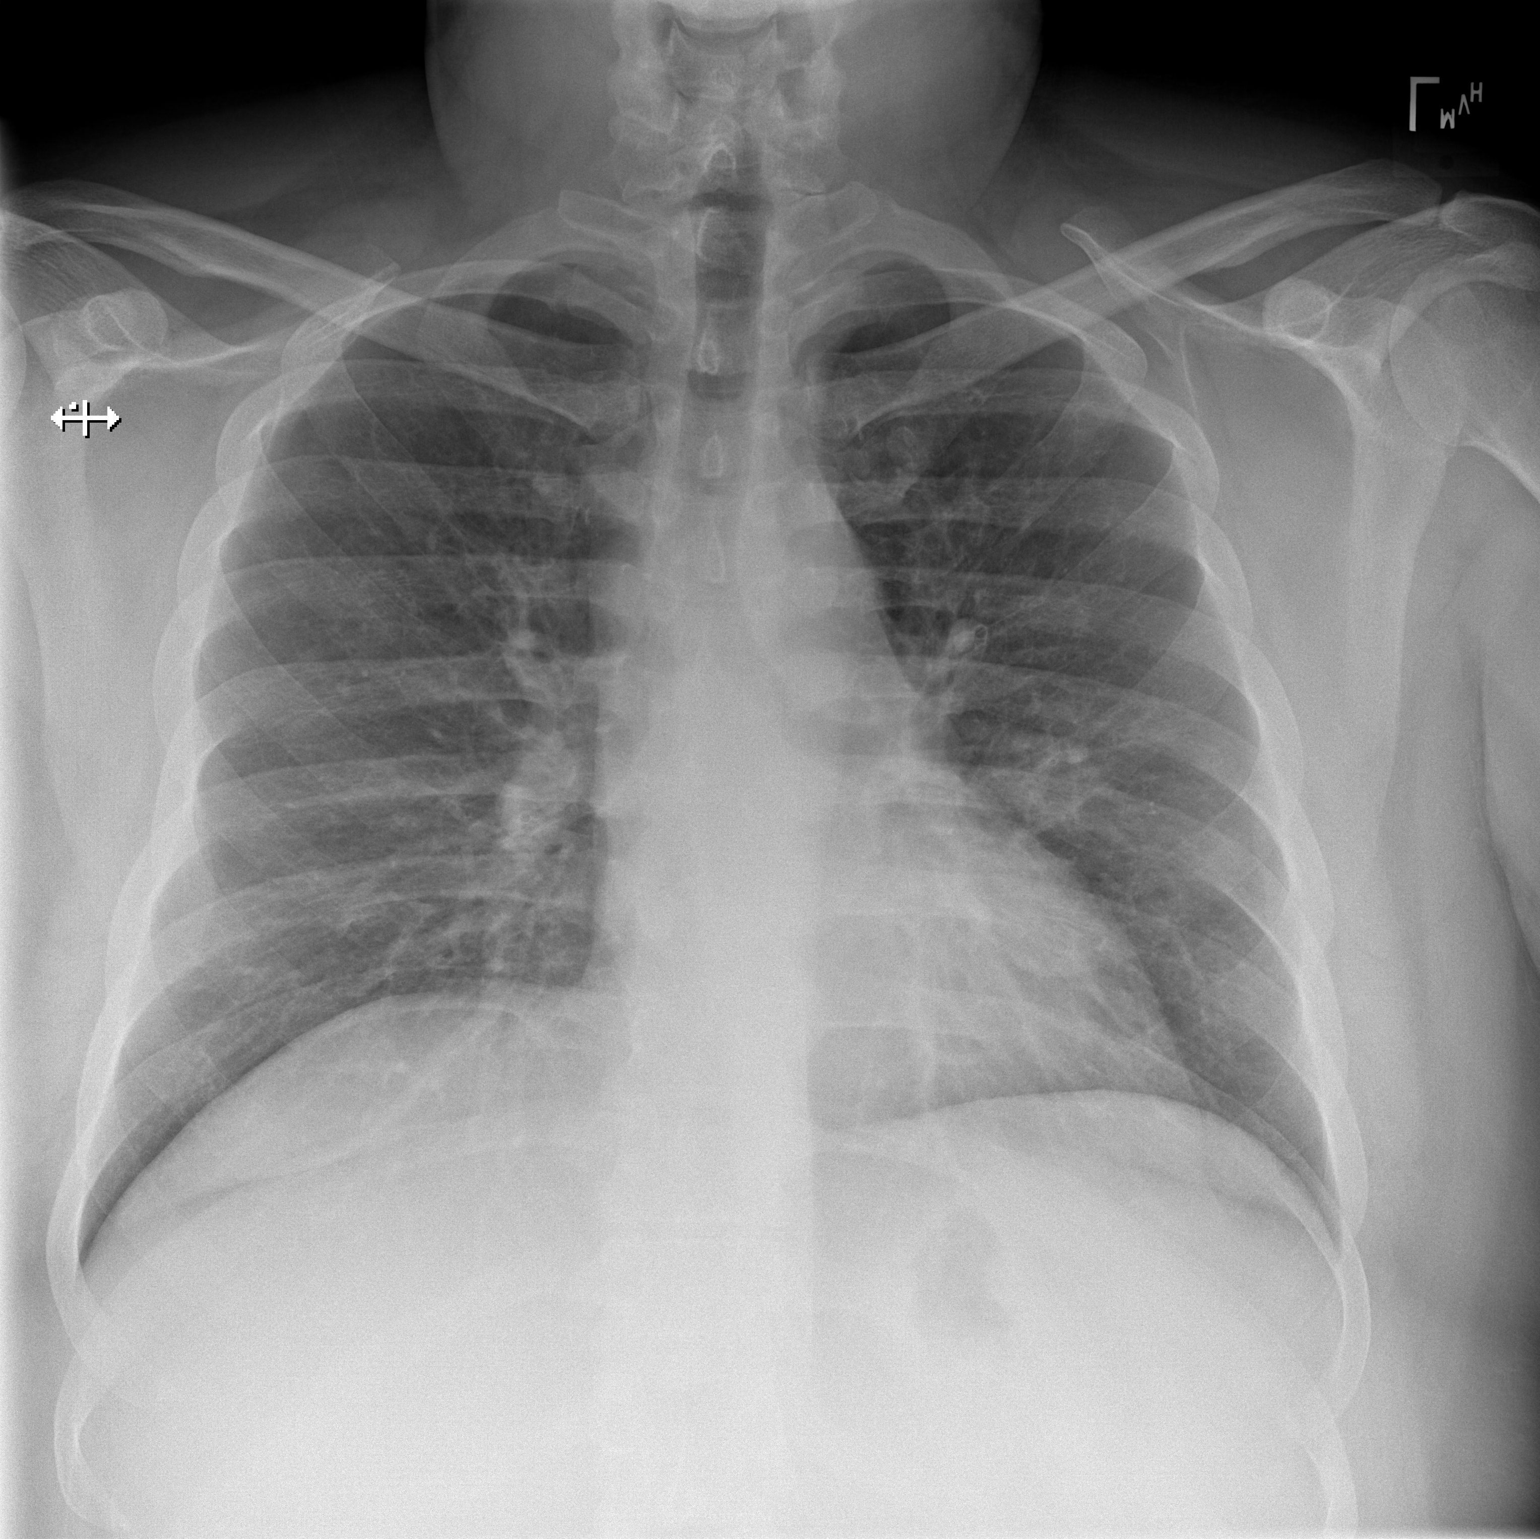

[w chest lat]
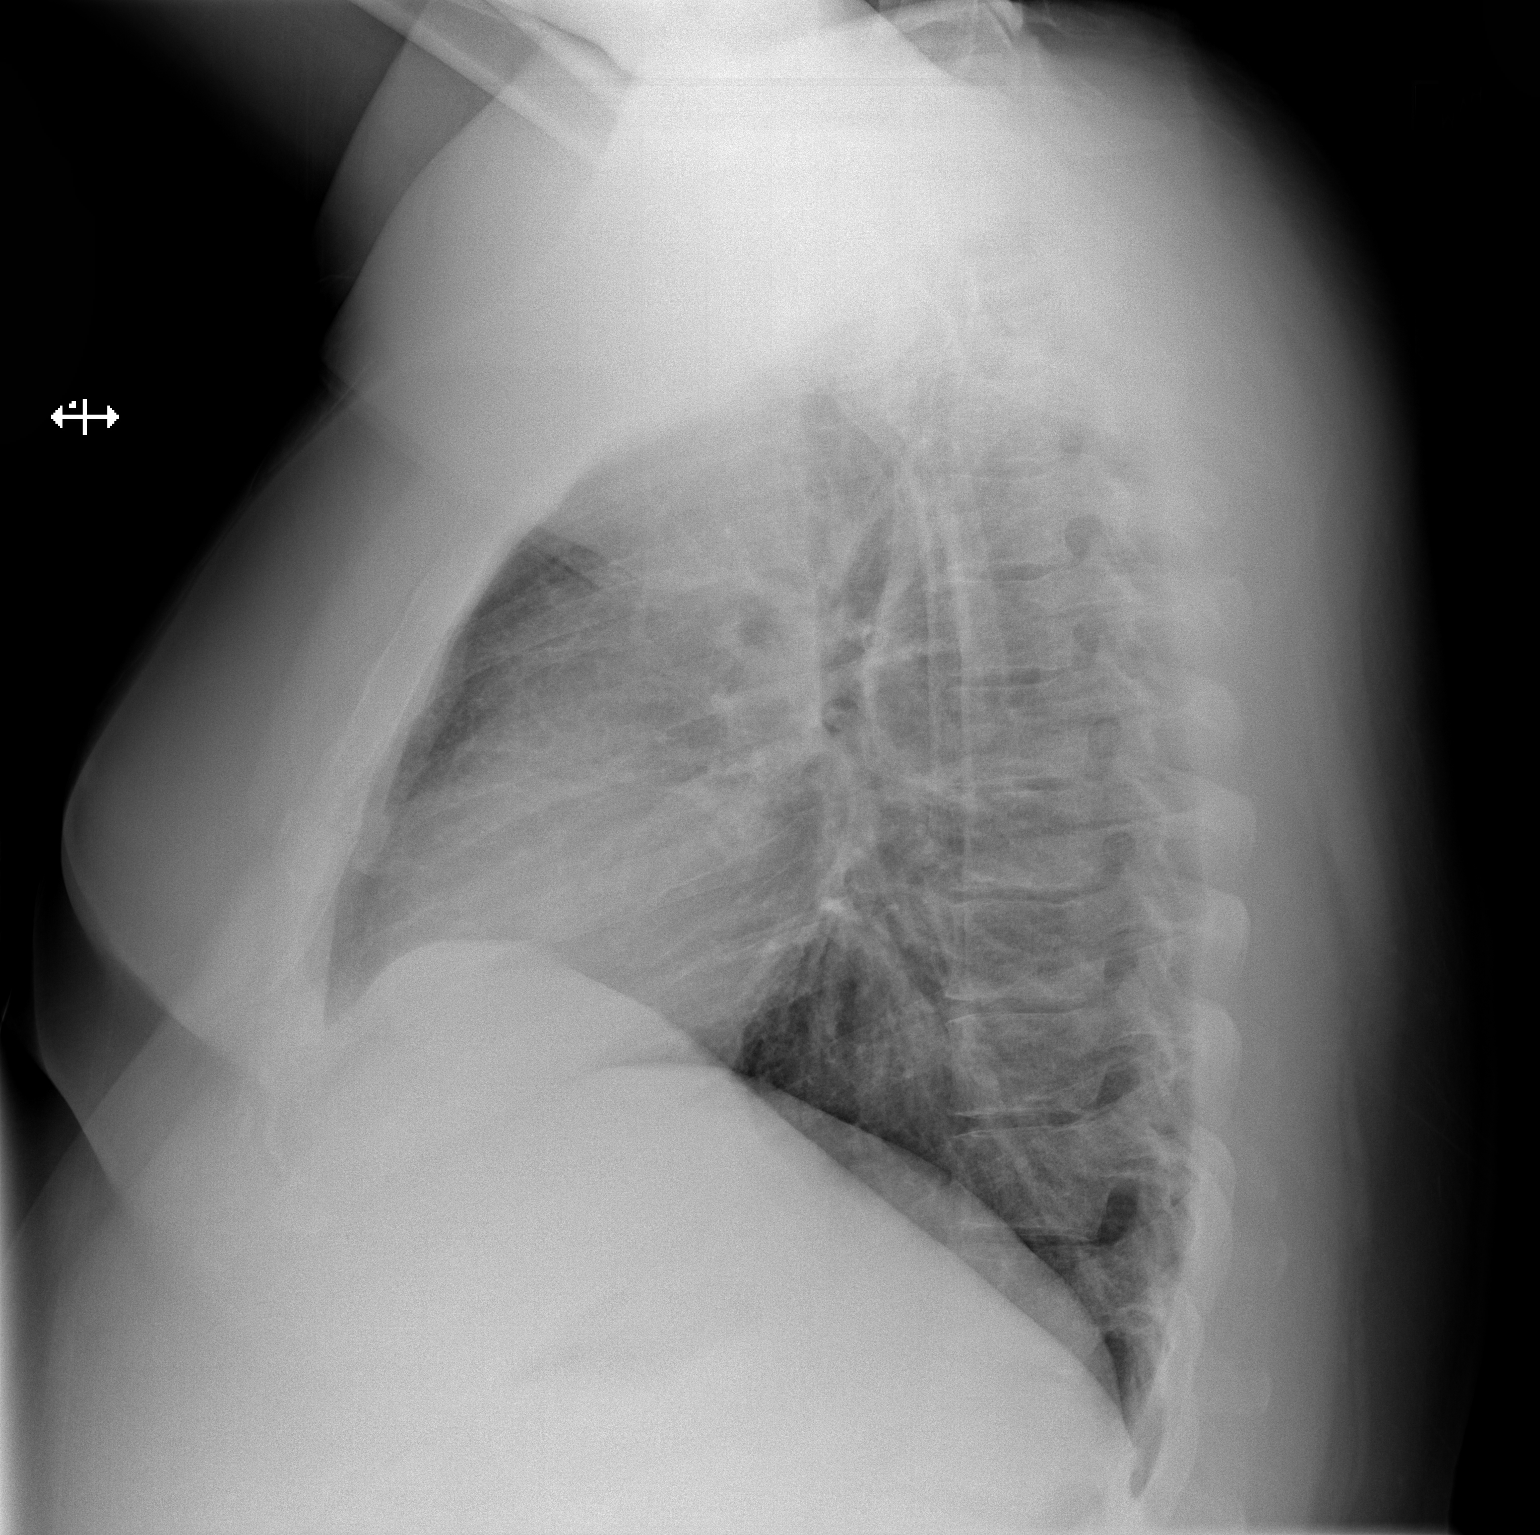

[2 of 2 positions shown; findings below may reference images not displayed]

FINDINGS: The cardiomediastinal contours are within normal limits. No focal
consolidation. Mild prominence of the interstitium. No pneumothorax
or pleural effusion. No acute finding in the visualized skeleton.
IMPRESSION: Mild prominence of the interstitium which is nonspecific, could be
represent bronchitic changes related to smoking history or viral
infection in the acute setting.
